# Patient Record
Sex: Male | Born: 1985 | Race: White | Hispanic: No | Smoking: Current every day smoker
Health system: Southern US, Community
[De-identification: ages and names within clinical notes are randomized; demographics above are authoritative.]

## PROBLEM LIST (undated history)

## (undated) DIAGNOSIS — G8929 Other chronic pain: Secondary | ICD-10-CM

## (undated) DIAGNOSIS — M419 Scoliosis, unspecified: Secondary | ICD-10-CM

## (undated) DIAGNOSIS — M5430 Sciatica, unspecified side: Secondary | ICD-10-CM

## (undated) DIAGNOSIS — M549 Dorsalgia, unspecified: Secondary | ICD-10-CM

---

## 2012-01-11 ENCOUNTER — Emergency Department (HOSPITAL_COMMUNITY)
Admission: EM | Admit: 2012-01-11 | Discharge: 2012-01-11 | Disposition: A | Discharge status: Home / Self Care | Payer: No Typology Code available for payment source | Attending: Emergency Medicine | Admitting: Emergency Medicine

## 2012-01-11 ENCOUNTER — Emergency Department (HOSPITAL_COMMUNITY): Payer: Self-pay

## 2012-01-11 ENCOUNTER — Encounter (HOSPITAL_COMMUNITY): Payer: Self-pay | Admitting: Emergency Medicine

## 2012-01-11 DIAGNOSIS — IMO0002 Reserved for concepts with insufficient information to code with codable children: Secondary | ICD-10-CM | POA: Insufficient documentation

## 2012-01-11 DIAGNOSIS — Y9389 Activity, other specified: Secondary | ICD-10-CM | POA: Insufficient documentation

## 2012-01-11 DIAGNOSIS — Z8739 Personal history of other diseases of the musculoskeletal system and connective tissue: Secondary | ICD-10-CM | POA: Insufficient documentation

## 2012-01-11 DIAGNOSIS — S0993XA Unspecified injury of face, initial encounter: Secondary | ICD-10-CM | POA: Insufficient documentation

## 2012-01-11 DIAGNOSIS — F17200 Nicotine dependence, unspecified, uncomplicated: Secondary | ICD-10-CM | POA: Insufficient documentation

## 2012-01-11 DIAGNOSIS — M549 Dorsalgia, unspecified: Secondary | ICD-10-CM

## 2012-01-11 DIAGNOSIS — S199XXA Unspecified injury of neck, initial encounter: Secondary | ICD-10-CM | POA: Insufficient documentation

## 2012-01-11 HISTORY — DX: Scoliosis, unspecified: M41.9

## 2012-01-11 MED ORDER — HYDROMORPHONE HCL PF 1 MG/ML IJ SOLN
1.0000 mg | Freq: Once | INTRAMUSCULAR | Status: AC
  Administered 2012-01-11: 1 mg via INTRAMUSCULAR
  Filled 2012-01-11: qty 1

## 2012-01-11 MED ORDER — DIAZEPAM 5 MG PO TABS
5.0000 mg | ORAL_TABLET | Freq: Once | ORAL | Status: AC
  Administered 2012-01-11: 5 mg via ORAL
  Filled 2012-01-11: qty 1

## 2012-01-11 MED ORDER — DIAZEPAM 5 MG PO TABS: 5.0000 mg | ORAL_TABLET | Freq: Three times a day (TID) | ORAL | Status: DC | PRN

## 2012-01-11 MED ORDER — IBUPROFEN 800 MG PO TABS: 800.0000 mg | ORAL_TABLET | Freq: Three times a day (TID) | ORAL | Status: DC | PRN

## 2012-01-11 MED ORDER — OXYCODONE-ACETAMINOPHEN 5-325 MG PO TABS: 1.0000 | ORAL_TABLET | Freq: Four times a day (QID) | ORAL | Status: DC | PRN

## 2012-01-11 MED ORDER — OXYCODONE-ACETAMINOPHEN 5-325 MG PO TABS
1.0000 | ORAL_TABLET | Freq: Once | ORAL | Status: AC
  Administered 2012-01-11: 1 via ORAL
  Filled 2012-01-11: qty 1

## 2012-01-11 MED ORDER — IBUPROFEN 800 MG PO TABS
800.0000 mg | ORAL_TABLET | Freq: Once | ORAL | Status: AC
  Administered 2012-01-11: 800 mg via ORAL
  Filled 2012-01-11: qty 1
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
## 2012-01-11 NOTE — ED Notes (Signed)
Patient transported to X-ray 
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
## 2012-01-12 NOTE — ED Provider Notes (Signed)
Medical screening examination/treatment/procedure(s) were performed by non-physician practitioner and as supervising physician I was immediately available for consultation/collaboration.  Juliet Rude. Rubin Payor, MD 01/12/12 0003
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
## 2015-01-12 ENCOUNTER — Encounter (HOSPITAL_COMMUNITY): Payer: Self-pay | Admitting: Emergency Medicine

## 2015-01-12 ENCOUNTER — Emergency Department (HOSPITAL_COMMUNITY)
Admission: EM | Admit: 2015-01-12 | Discharge: 2015-01-12 | Disposition: A | Discharge status: Home / Self Care | Payer: Self-pay | Attending: Emergency Medicine | Admitting: Emergency Medicine

## 2015-01-12 DIAGNOSIS — Z79899 Other long term (current) drug therapy: Secondary | ICD-10-CM | POA: Insufficient documentation

## 2015-01-12 DIAGNOSIS — M62830 Muscle spasm of back: Secondary | ICD-10-CM | POA: Insufficient documentation

## 2015-01-12 DIAGNOSIS — Z880 Allergy status to penicillin: Secondary | ICD-10-CM | POA: Insufficient documentation

## 2015-01-12 DIAGNOSIS — F17200 Nicotine dependence, unspecified, uncomplicated: Secondary | ICD-10-CM | POA: Insufficient documentation

## 2015-01-12 DIAGNOSIS — M545 Low back pain: Secondary | ICD-10-CM | POA: Insufficient documentation

## 2015-01-12 DIAGNOSIS — M419 Scoliosis, unspecified: Secondary | ICD-10-CM | POA: Insufficient documentation

## 2015-01-12 MED ORDER — IBUPROFEN 800 MG PO TABS: 800.0000 mg | ORAL_TABLET | Freq: Three times a day (TID) | ORAL | Status: DC

## 2015-01-12 MED ORDER — KETOROLAC TROMETHAMINE 60 MG/2ML IM SOLN
60.0000 mg | Freq: Once | INTRAMUSCULAR | Status: AC
  Administered 2015-01-12: 60 mg via INTRAMUSCULAR
  Filled 2015-01-12: qty 2

## 2015-01-12 MED ORDER — DEXAMETHASONE SODIUM PHOSPHATE 10 MG/ML IJ SOLN
10.0000 mg | Freq: Once | INTRAMUSCULAR | Status: AC
  Administered 2015-01-12: 10 mg via INTRAMUSCULAR
  Filled 2015-01-12: qty 1

## 2015-01-12 MED ORDER — CYCLOBENZAPRINE HCL 10 MG PO TABS
5.0000 mg | ORAL_TABLET | Freq: Once | ORAL | Status: AC
  Administered 2015-01-12: 5 mg via ORAL
  Filled 2015-01-12: qty 1

## 2015-01-12 MED ORDER — CYCLOBENZAPRINE HCL 5 MG PO TABS: 5.0000 mg | ORAL_TABLET | Freq: Three times a day (TID) | ORAL | Status: AC | PRN

## 2015-01-12 NOTE — ED Provider Notes (Signed)
CSN: [id number]     Arrival date & time 01/12/15  1544 History  By signing my name below, I, Doreatha Martin, attest that this documentation has been prepared under the direction and in the presence of Cheri Fowler, PA-C. Electronically Signed: Doreatha Martin, ED Scribe. 01/12/2015. 4:47 PM.    Chief Complaint  Patient presents with  . Back Pain   The history is provided by the patient. No language interpreter was used.   HPI Comments: Gregory Prince is a 30 y.o. male with h/o scoliosis who presents to the Emergency Department complaining of moderate, gradual onset and worsening right lower back pain for 4 days with associated pain radiation to the left leg. Pt states his current pain feels like his h/o scoliosis. He states he has tried ice and 800 mg ibuprofen x3/day with mild to moderate temporary relief of pain. Pt notes that pain is worsened with ambulation and movement. He reports he has not been seen by a specialist for his scoliosis since 2009 due to a lack of insurance, but was followed in the past by various providers. No noted re-injury or trauma. No Hx of cancer, IV drug use. He denies fever, focal weakness, abdominal pain, dysuria, hematuria, bowel or bladder incontinence, saddle anesthesia.     Past Medical History  Diagnosis Date  . Scoliosis    History reviewed. No pertinent past surgical history. No family history on file. Social History  Substance Use Topics  . Smoking status: Current Every Day Smoker  . Smokeless tobacco: None  . Alcohol Use: No    Review of Systems A complete 10 system review of systems was obtained and all systems are negative except as noted in the HPI and PMH.    Allergies  Amoxicillin and Penicillins  Home Medications   Prior to Admission medications   Medication Sig Start Date End Date Taking? Authorizing Provider  Acetaminophen-Aspirin Buffered (EXCEDRIN BACK & BODY) 250-250 MG tablet Take 2 tablets by mouth every 4 (four) hours as needed. For  pain.    Historical Provider, MD  cyclobenzaprine (FLEXERIL) 5 MG tablet Take 1 tablet (5 mg total) by mouth 3 (three) times daily as needed for muscle spasms. 01/12/15   Keelyn Monjaras, PA-C  diazepam (VALIUM) 5 MG tablet Take 1 tablet (5 mg total) by mouth every 8 (eight) hours as needed for anxiety. 01/11/12   Lisette Paz, PA-C  ibuprofen (ADVIL,MOTRIN) 800 MG tablet Take 1 tablet (800 mg total) by mouth 3 (three) times daily. 01/12/15   Cheri Fowler, PA-C  Multiple Vitamin (MULTIVITAMIN WITH MINERALS) TABS Take 1 tablet by mouth daily.    Historical Provider, MD  oxyCODONE-acetaminophen (PERCOCET/ROXICET) 5-325 MG per tablet Take 1 tablet by mouth every 6 (six) hours as needed for pain. 01/11/12   Lisette Paz, PA-C  vitamin C (ASCORBIC ACID) 500 MG tablet Take 500 mg by mouth daily.    Historical Provider, MD   BP 131/86 mmHg  Pulse 87  Temp(Src) 97.4 F (36.3 C) (Oral)  Resp 16  Ht 5\' 9"  (1.753 m)  Wt 79.379 kg  BMI 25.83 kg/m2  SpO2 100% Physical Exam  Constitutional: He is oriented to person, place, and time. He appears well-developed and well-nourished.  HENT:  Head: Normocephalic and atraumatic.  Eyes: Conjunctivae and EOM are normal. Pupils are equal, round, and reactive to light.  Neck: Normal range of motion. Neck supple.  Cardiovascular: Normal rate, regular rhythm and normal heart sounds.  Exam reveals no gallop and no friction  rub.   No murmur heard. Pulses:      Dorsalis pedis pulses are 2+ on the right side, and 2+ on the left side.  Pulmonary/Chest: Effort normal and breath sounds normal. No respiratory distress.  Abdominal: Soft. Bowel sounds are normal. He exhibits no distension.  Musculoskeletal: Normal range of motion. He exhibits tenderness.       Lumbar back: He exhibits spasm.       Back:  Mildly TTP in the lumbar spine. Muscle spasm noted in the right lower lumbar musculature. No step-offs or crepitus.   Neurological: He is alert and oriented to person, place, and  time.  No saddle anesthesia. Strength and sensation intact bilaterally throughout the lower extremities.   Skin: Skin is warm and dry.  Psychiatric: He has a normal mood and affect. His behavior is normal.  Nursing note and vitals reviewed.  ED Course  Procedures (including critical care time) DIAGNOSTIC STUDIES: Oxygen Saturation is 97% on RA, normal by my interpretation.    COORDINATION OF CARE: 4:43 PM Discussed treatment plan with pt at bedside which includes IM Toradol and Decadron and pt agreed to plan. Will d/c with Flexeril.    MDM   Final diagnoses:  Low back pain, unspecified back pain laterality, with sciatica presence unspecified   Filed Vitals:   01/12/15 1600 01/12/15 1720  BP: 147/96 131/86  Pulse: 115 87  Temp: 97.4 F (36.3 C)   Resp: 16 16     Meds given in ED:  Medications  ketorolac (TORADOL) injection 60 mg (60 mg Intramuscular Given 01/12/15 1705)  dexamethasone (DECADRON) injection 10 mg (10 mg Intramuscular Given 01/12/15 1705)  cyclobenzaprine (FLEXERIL) tablet 5 mg (5 mg Oral Given 01/12/15 1706)    New Prescriptions   CYCLOBENZAPRINE (FLEXERIL) 5 MG TABLET    Take 1 tablet (5 mg total) by mouth 3 (three) times daily as needed for muscle spasms.   IBUPROFEN (ADVIL,MOTRIN) 800 MG TABLET    Take 1 tablet (800 mg total) by mouth 3 (three) times daily.    Patient with back pain and likely muscle spasm.  No neurological deficits and normal neuro exam.  Patient is ambulatory.  No loss of bowel or bladder control.  No concern for cauda equina. No red flags. No fever, night sweats, weight loss, h/o cancer, IVDA, no recent procedure to back. No urinary symptoms suggestive of UTI.  Initial vitals showed HR 115, likely secondary to walking in from parking lot.  Upon recheck, VSS.  Blood pressure 131/86, pulse 87, temperature 97.4 F (36.3 C), temperature source Oral, resp. rate 16, height 5\' 9"  (1.753 m), weight 79.379 kg, SpO2 100 %. Supportive care and  return precaution discussed. Pain management with Flexeril and continued ibuprofen and ice use. Appears safe for discharge at this time. Follow up as indicated in discharge paperwork.    I personally performed the services described in this documentation, which was scribed in my presence. The recorded information has been reviewed and is accurate.     Cheri FowlerKayla Artez Regis, PA-C 01/12/15 1727  Mancel BaleElliott Wentz, MD 01/12/15 [phone]

## 2015-01-12 NOTE — ED Notes (Signed)
Pt states for the last 3 days he has been having worsening back pain that is now radating into his left leg. Pt states he has a history of scoliosis and feels like similar pain he has had in the past. Pt denies any numbness or tingling. Pt denies loss of control of bowel or bladder.

## 2015-01-12 NOTE — Discharge Instructions (Signed)
Back Pain, Adult °Back pain is very common in adults. The cause of back pain is rarely dangerous and the pain often gets better over time. The cause of your back pain may not be known. Some common causes of back pain include: °· Strain of the muscles or ligaments supporting the spine. °· Wear and tear (degeneration) of the spinal disks. °· Arthritis. °· Direct injury to the back. °For many people, back pain may return. Since back pain is rarely dangerous, most people can learn to manage this condition on their own. °HOME CARE INSTRUCTIONS °Watch your back pain for any changes. The following actions may help to lessen any discomfort you are feeling: °· Remain active. It is stressful on your back to sit or stand in one place for long periods of time. Do not sit, drive, or stand in one place for more than 30 minutes at a time. Take short walks on even surfaces as soon as you are able. Try to increase the length of time you walk each day. °· Exercise regularly as directed by your health care provider. Exercise helps your back heal faster. It also helps avoid future injury by keeping your muscles strong and flexible. °· Do not stay in bed. Resting more than 1-2 days can delay your recovery. °· Pay attention to your body when you bend and lift. The most comfortable positions are those that put less stress on your recovering back. Always use proper lifting techniques, including: °¨ Bending your knees. °¨ Keeping the load close to your body. °¨ Avoiding twisting. °· Find a comfortable position to sleep. Use a firm mattress and lie on your side with your knees slightly bent. If you lie on your back, put a pillow under your knees. °· Avoid feeling anxious or stressed. Stress increases muscle tension and can worsen back pain. It is important to recognize when you are anxious or stressed and learn ways to manage it, such as with exercise. °· Take medicines only as directed by your health care provider. Over-the-counter  medicines to reduce pain and inflammation are often the most helpful. Your health care provider may prescribe muscle relaxant drugs. These medicines help dull your pain so you can more quickly return to your normal activities and healthy exercise. °· Apply ice to the injured area: °¨ Put ice in a plastic bag. °¨ Place a towel between your skin and the bag. °¨ Leave the ice on for 20 minutes, 2-3 times a day for the first 2-3 days. After that, ice and heat may be alternated to reduce pain and spasms. °· Maintain a healthy weight. Excess weight puts extra stress on your back and makes it difficult to maintain good posture. °SEEK MEDICAL CARE IF: °· You have pain that is not relieved with rest or medicine. °· You have increasing pain going down into the legs or buttocks. °· You have pain that does not improve in one week. °· You have night pain. °· You lose weight. °· You have a fever or chills. °SEEK IMMEDIATE MEDICAL CARE IF:  °· You develop new bowel or bladder control problems. °· You have unusual weakness or numbness in your arms or legs. °· You develop nausea or vomiting. °· You develop abdominal pain. °· You feel faint. °  °This information is not intended to replace advice given to you by your health care provider. Make sure you discuss any questions you have with your health care provider. °  °Document Released: 12/19/2004 Document Revised: 01/09/2014 Document Reviewed: 04/22/2013 °Elsevier Interactive Patient Education ©2016 Elsevier   Inc. ° °Emergency Department Resource Guide °1) Find a Doctor and Pay Out of Pocket °Although you won't have to find out who is covered by your insurance plan, it is a good idea to ask around and get recommendations. You will then need to call the office and see if the doctor you have chosen will accept you as a new patient and what types of options they offer for patients who are self-pay. Some doctors offer discounts or will set up payment plans for their patients who do not  have insurance, but you will need to ask so you aren't surprised when you get to your appointment. ° °2) Contact Your Local Health Department °Not all health departments have doctors that can see patients for sick visits, but many do, so it is worth a call to see if yours does. If you don't know where your local health department is, you can check in your phone book. The CDC also has a tool to help you locate your state's health department, and many state websites also have listings of all of their local health departments. ° °3) Find a Walk-in Clinic °If your illness is not likely to be very severe or complicated, you may want to try a walk in clinic. These are popping up all over the country in pharmacies, drugstores, and shopping centers. They're usually staffed by nurse practitioners or physician assistants that have been trained to treat common illnesses and complaints. They're usually fairly quick and inexpensive. However, if you have serious medical issues or chronic medical problems, these are probably not your best option. ° °No Primary Care Doctor: °- Call [facility] at  832-8000 - they can help you locate a primary care doctor that  accepts your insurance, provides certain services, etc. °- Physician Referral Service- 1-800-533-3463 ° °Chronic Pain Problems: °Organization         Address  Phone   Notes  °[facility]  (336) 297-2271 Patients need to be referred by their primary care doctor.  ° °Medication Assistance: °Organization         Address  Phone   Notes  °[place] [facility] [address] °[place], [place] [address] (336) 641-8030 --Must be a resident of [place] °-- Must have NO insurance coverage whatsoever (no Medicaid/ Medicare, etc.) °-- The pt. MUST have a primary care doctor that directs their care regularly and follows them in the community °  °[facility]  (866) 331-1348   °[employer]  (888) 892-1162   ° °Agencies that  provide inexpensive medical care: °Organization         Address  Phone   Notes  °[facility]  (336) 832-8035   °[facility]    (336) 832-7272   °[facility] [address] °[place], [place] [address] (336) 832-4777   °[facility] [address], °[place] (336) 271-4999   °[facility]    (336) 373-0678   °[facility]    (336) 272-1050   °[facility] ° [address] Phone:  (336) 832-4444, Fax:  (336) 832-4440 Hours of Operation:  9 am - 6 pm, M-F.  Also accepts Medicaid/Medicare and self-pay.  °[facility] ° [address], [place] Phone: (336) 832-3150, Fax: (336) 832-3151. Hours of Operation:  8:30 am - 5:30 pm, M-F.  Also accepts Medicaid and self-pay.  °[facility]   [place] [address], [place] Phone: (336) 878-6027   °[facility] [address], [place], [place] (336)723-1848, Ext. 123 Mondays & Thursdays: 7-9 AM.  First 15 patients are seen on a first come, first serve basis. °  ° °Medicaid-accepting [place] Providers: ° °Organization         Address  Phone   Notes  °[facility] [address], [place] (336) 641-2100 Also accepts self-pay patients.  °[facility] [address], [place] ° (336) 856-9996   °[facility] [address], [place] (336) 288-8857   °[facility] [address] [place] [address], [place] (336) 299-7000   °Veita Bland [address], [place]  ° (336) 373-1557 Only accepts [place] Access Medicaid patients after they have their name applied to their card.  ° °Self-Pay (no insurance) in [place]: ° °Organization         Address  Phone   Notes  °Sickle Cell Patients, [facility] [address], [place] (336) 832-1970   °[facility] [address], [place] (336) 832-4400   °[facility] ° [address],  (336) 992-4800   °[facility]/Dr. Osei-Bonsu ° [address] [place] [address], [place] or [address], [place] (336) 841-8500 Phone number for both [place] and [place] locations is the same.  °[facility] [address], [place] (336) 299-0000   °[facility] [address] [place] [address], [place] or [address] (336) 852-7530 °(336) 878-2260   °[facility] [address], [place] (336) 350-1642, phone; (336) 294-5005, fax Sees patients 1st and 3rd Saturday of every month.  Must not qualify for public or private insurance (i.e. Medicaid, Medicare, [place] Health Choice, Veterans' Benefits) • Household income should be no more than 200% of the poverty level •The clinic cannot treat you if you are pregnant or think you are pregnant • Sexually transmitted diseases are not treated at the clinic.  ° ° °Dental Care: °Organization         Address  Phone  Notes  °[facility] [address], [place] (336) 641-6152 Accepts children up to age 21 who are enrolled in Medicaid or [place] Health Choice; pregnant women with a Medicaid card; and children who have applied for Medicaid or [place] Health Choice, but were declined, whose parents can pay a reduced fee at time of service.  °[facility] [place]  [address], [place] (336) 641-7733 Accepts children up to age 21 who are enrolled in Medicaid or [place] Health Choice; pregnant women with a Medicaid card; and children who have applied for Medicaid or [place] Health Choice, but were declined, whose parents can pay a reduced fee at time of service.  °[facility] ° [address], [place] (336) 641-4533 Patients are seen by appointment only. Walk-ins  are not accepted. [facility] will see patients 18 years of age and older. °Monday - Tuesday (8am-5pm) °Most Wednesdays (8:30-5pm) °$30 per visit, cash only  °[facility] ° [address], [place] (336) 641-4533 Patients are seen by appointment only. Walk-ins are not accepted. [facility] will see patients 18 years of age   and older. °One Wednesday Evening (Monthly: Volunteer Based).  $30 per visit, cash only  °[facility]  (919) 537-3737 for adults; Children under age 4, call Graduate Pediatric Dentistry at ([phone]) [phone]. Children aged 4-14, please call (919) [phone] to request a pediatric application. ° Dental services are provided in all areas of dental care including fillings, crowns and bridges, complete and partial dentures, implants, gum treatment, root canals, and extractions. Preventive care is also provided. Treatment is provided to both adults and children. °Patients are selected via a lottery and there is often a waiting list. °  °[facility] [address], °[place] ° (336) 763-8833 [email or web address] °  °[facility] [address], [place], [place] (336)723-1848, Ext. 123 Second and Fourth Thursday of each month, opens at 6:30 AM; Clinic ends at 9 AM.  Patients are seen on a first-come first-served basis, and a limited number are seen during each clinic.  ° °[facility] ° [address], [place], [place] (336) 723-7904   Eligibility Requirements °You must have lived in [place], [place], or [place] counties for at least the last three months. °  You cannot be eligible for state or federal sponsored healthcare insurance, including Veterans Administration, Medicaid, or Medicare. °  You generally cannot be eligible for healthcare insurance through your employer.  °  How to apply: °Eligibility screenings are held every Tuesday and Wednesday afternoon from 1:00 pm until 4:00 pm. You do not need an appointment  for the interview!  °[facility] [address], [place], [place] 336-631-2330   °[facility]  336-342-8273   °[facility]  336-703-3100   °[facility]  336-570-6415   ° °Behavioral Health Resources in the Community: °Intensive Outpatient Programs °Organization         Address  Phone  Notes  °[facility] [address], [place], [place] 336-878-6098   °[facility] [address], [place], [place] 336-832-9800   °[facility] [address], [place], [place] ° 336-882-2125   °[facility] [address],  °[place], [place] 1-800-853-5163 or 336-641-4981   °Substance Abuse Resources °Organization         Address  Phone  Notes  °[facility]  336-882-2125   °[facility]  336-784-9470   °[facility]  336-285-9073   °[facility]  336-845-3988   °Residential & Outpatient Substance Abuse Program  1-800-659-3381   °Psychological Services °Organization         Address  Phone  Notes  °[facility]  336- 832-9600   °[facility]  336- 378-7881   °[facility] [address], [place] 1-800-853-5163 or 336-641-4981   ° °Mobile Crisis Teams °Organization         Address  Phone  Notes  °[facility], Mobile Crisis Care Unit  1-877-626-1772   °Assertive °[facility] ° [address] [place], [place] 336-834-9664   °Sharon DeEsch [address] °[place] 336-554-5454   ° °Self-Help/Support Groups °Organization         Address  Phone             Notes  °[facility] - variety of support groups  336- 373-1402 Call for more information  °Narcotics Anonymous (NA), [facility] [address], °[place]  2 meetings at   this location  ° °Residential Treatment Programs °Organization         Address  Phone  Notes  °[facility] [address],    °[place]  1-866-801-8205   °[facility] ° [address], [place], [place] 704-293-8524   °[facility] [address], [place] 336-845-3988 Admissions: 8am-3pm M-F  °[facility] [address]    °[place], [place] 336-841-1104   °[facility] [address], [place], [place] 336-379-7146   °[facility] [address]  °[place], [place] 336-285-9073   °[facility] Intensive Outpatient [address], [place], [place] 336-852-3033   °[facility] [address]  °[place], [place] 1-877-615-2722 or 336-784-9470   °[facility] [address] [place], [place] 336-227-7417 Accepts Medicaid  °[facility] [address]  °[place] 1-800-659-3381 Substance Abuse/Addiction Treatment  ° °[place] Behavioral Health Resources °Organization         Address  Phone  Notes  °[facility]  (888) 581-9988   °Julie Brannon, PhD [address] [place], [place]   (336) 349-5553 or (336) 951-0000   °[facility]   [address] °[place], [place] (336) 349-4454   °[facility] [address], [place], [place] (336) 342-8316 Insurance/Medicaid/sponsorship through [facility]  °[facility] [address]                                    [place], [place] (336) 342-8316 Therapy/tele-psych/case  °[facility] [address]  ° [place], [place] (336) 349-2233    °Dr. Arfeen  (336) 349-4544   °[facility] [place]  [employer] [place] [facility] 1) [address],  °2) [address], [place] °3)  [address], [place] (336) 349-3220 °(336) 342-7768 ° °(336) 342-8140   °[place] [facility] (336) 342-1394 or (336) 342-3537 (After Hours)    ° ° ° °

## 2015-01-13 ENCOUNTER — Encounter (HOSPITAL_COMMUNITY): Payer: Self-pay | Admitting: *Deleted

## 2015-01-13 ENCOUNTER — Emergency Department (HOSPITAL_COMMUNITY)
Admission: EM | Admit: 2015-01-13 | Discharge: 2015-01-13 | Disposition: A | Discharge status: Home / Self Care | Payer: Self-pay | Attending: Emergency Medicine | Admitting: Emergency Medicine

## 2015-01-13 DIAGNOSIS — R000 Tachycardia, unspecified: Secondary | ICD-10-CM | POA: Insufficient documentation

## 2015-01-13 DIAGNOSIS — G8929 Other chronic pain: Secondary | ICD-10-CM | POA: Insufficient documentation

## 2015-01-13 DIAGNOSIS — M549 Dorsalgia, unspecified: Secondary | ICD-10-CM

## 2015-01-13 DIAGNOSIS — Z880 Allergy status to penicillin: Secondary | ICD-10-CM | POA: Insufficient documentation

## 2015-01-13 DIAGNOSIS — M5442 Lumbago with sciatica, left side: Secondary | ICD-10-CM | POA: Insufficient documentation

## 2015-01-13 DIAGNOSIS — F17200 Nicotine dependence, unspecified, uncomplicated: Secondary | ICD-10-CM | POA: Insufficient documentation

## 2015-01-13 DIAGNOSIS — Z79899 Other long term (current) drug therapy: Secondary | ICD-10-CM | POA: Insufficient documentation

## 2015-01-13 HISTORY — DX: Dorsalgia, unspecified: M54.9

## 2015-01-13 HISTORY — DX: Other chronic pain: G89.29

## 2015-01-13 MED ORDER — OXYCODONE-ACETAMINOPHEN 5-325 MG PO TABS
1.0000 | ORAL_TABLET | Freq: Once | ORAL | Status: AC
Start: 2015-01-13 — End: 2015-01-13
  Administered 2015-01-13: 1 via ORAL
  Filled 2015-01-13: qty 1

## 2015-01-13 MED ORDER — KETOROLAC TROMETHAMINE 30 MG/ML IJ SOLN
30.0000 mg | Freq: Once | INTRAMUSCULAR | Status: AC
  Administered 2015-01-13: 30 mg via INTRAVENOUS
  Filled 2015-01-13: qty 1

## 2015-01-13 MED ORDER — SODIUM CHLORIDE 0.9 % IV BOLUS (SEPSIS)
1000.0000 mL | Freq: Once | INTRAVENOUS | Status: AC
  Administered 2015-01-13: 1000 mL via INTRAVENOUS

## 2015-01-13 MED ORDER — OXYCODONE-ACETAMINOPHEN 5-325 MG PO TABS: 1.0000 | ORAL_TABLET | Freq: Four times a day (QID) | ORAL | Status: DC | PRN

## 2015-01-13 MED ORDER — ONDANSETRON 4 MG PO TBDP
4.0000 mg | ORAL_TABLET | Freq: Once | ORAL | Status: AC
  Administered 2015-01-13: 4 mg via ORAL
  Filled 2015-01-13: qty 1

## 2015-01-13 NOTE — ED Provider Notes (Signed)
CSN: [id number]     Arrival date & time 01/13/15  1191 History   First MD Initiated Contact with Patient 01/13/15 [phone]     Chief Complaint  Patient presents with  . Back Pain   HPI  Mr. Coppola is an 30 y.o. male with history of scoliosis and chronic back pain who presents to the ED for evaluation of back pain. He was seen in the ED yesterday for the same complaint. Pt states he has had scoliosis since he was a teenager and has always had issues with chronic back pain. He states that he recently lost his insurance and so has not had any of his chronic pain medications that he used in the past. He states that from time to time he will get pain flare ups with pain in his low back that radiate down his left leg and sometimes up his back. He states that normally the episodes last a day or two and resolve with ice and NSAIDs but this episode started on 01/09/15 and he has had no relief with ibuprofen. He was seen in the ED yesterday and given toradol, decadron, and rx for flexeril. He states that the medications have helped his muscle spasms but he states there is still sharp severe pain in his low back. He denies new injury or trauma. Denies fever, chills, new numbness/tingling/weakness. Denies saddle paresthesias, bowel/bladder incontinence, IVDU.  Pt has been tachycardic in the ED, initially to the 140s in triage where he was apparently also diaphoretic and SOB. In the room now pt is breathing comfortably, not diaphoretic, and has maintained SpO2 98-100% on RA. His heart rate during our initial conversation has stayed between 125-135. I suspect pt's tachycardia is secondary to his degree of pain as he does appear to be quite uncomfortable. He denies chest pain, difficulty breathing, feeling dizzy or lightheaded.  Past Medical History  Diagnosis Date  . Scoliosis   . Chronic back pain    History reviewed. No pertinent past surgical history. History reviewed. No pertinent family history. Social History   Substance Use Topics  . Smoking status: Current Every Day Smoker  . Smokeless tobacco: None  . Alcohol Use: No    Review of Systems  All other systems reviewed and are negative.     Allergies  Amoxicillin and Penicillins  Home Medications   Prior to Admission medications   Medication Sig Start Date End Date Taking? Authorizing Provider  Acetaminophen-Aspirin Buffered (EXCEDRIN BACK & BODY) 250-250 MG tablet Take 2 tablets by mouth every 4 (four) hours as needed. For pain.   Yes Historical Provider, MD  cyclobenzaprine (FLEXERIL) 5 MG tablet Take 1 tablet (5 mg total) by mouth 3 (three) times daily as needed for muscle spasms. 01/12/15  Yes Kayla Rose, PA-C  ibuprofen (ADVIL,MOTRIN) 800 MG tablet Take 1 tablet (800 mg total) by mouth 3 (three) times daily. 01/12/15  Yes Cheri Fowler, PA-C  Multiple Vitamin (MULTIVITAMIN WITH MINERALS) TABS Take 1 tablet by mouth daily.   Yes Historical Provider, MD  oxyCODONE-acetaminophen (PERCOCET/ROXICET) 5-325 MG per tablet Take 1 tablet by mouth every 6 (six) hours as needed for pain. 01/11/12  Yes Lisette Paz, PA-C  vitamin C (ASCORBIC ACID) 500 MG tablet Take 500 mg by mouth daily.   Yes Historical Provider, MD   BP 118/84 mmHg  Pulse 129  Temp(Src) 97.5 F (36.4 C) (Oral)  Resp 24  SpO2 98% Physical Exam  Constitutional: He is oriented to person, place, and time. No distress.  Appears uncomfortable but NAD  HENT:  Right Ear: External ear normal.  Left Ear: External ear normal.  Nose: Nose normal.  Mouth/Throat: Oropharynx is clear and moist.  Eyes: Conjunctivae and EOM are normal. Pupils are equal, round, and reactive to light.  Neck: Full passive range of motion without pain. Neck supple.  Cardiovascular: Regular rhythm, normal heart sounds and intact distal pulses.  Tachycardia present.   Pulmonary/Chest: Effort normal and breath sounds normal. No respiratory distress. He exhibits no tenderness.  Abdominal: Soft. Bowel sounds are  normal. He exhibits no distension. There is no tenderness.  Musculoskeletal: He exhibits no edema.  Low back paraspinal muscles diffusely tender to palpation with some spasm. No spinal tenderness. Intact strength and sensation in upper and lower extremities.   Neurological: He is alert and oriented to person, place, and time. No cranial nerve deficit. Coordination and gait normal.  Skin: Skin is warm and dry. He is not diaphoretic.  Psychiatric: He has a normal mood and affect.  Nursing note and vitals reviewed.  Filed Vitals:   01/13/15 1130 01/13/15 1200 01/13/15 1230 01/13/15 1249  BP: 128/87 128/82 119/73 119/73  Pulse: 100 111 94 105  Temp:      TempSrc:      Resp: 16   18  SpO2: 100% 99% 100% 100%     ED Course  Procedures (including critical care time) Labs Review Labs Reviewed - No data to display  Imaging Review No results found. I have personally reviewed and evaluated these images and lab results as part of my medical decision-making.   EKG Interpretation None      MDM   Final diagnoses:  Bilateral low back pain with left-sided sciatica    Pt is an 30 y.o. male with history of scoliosis and chronic back pain in the ED today for severe flare up of back pain. He was seen yesterday for same. No red flag symptoms for cauda equina, epidural abscess, neoplastic etiology. No new injury or trauma. No focal neuro findings. Pain improved with percocet and toradol in the ED. Pt was initially tachycardic to the 140s in triage, improved to 90s with 2L NS bolus and pain meds. I suspect pt's tachycardia is secondary to back pain as he is having no chest pain, SOB, increased work of breathing, hypoxia, or tachypnea to suggest other more emergent cardiopulmonary etiology. Given acute on chronic flare of scoliosis related back pain I will give short prescription for percocet. Pt instructed to continue the flexeril he has at home. HE may also continue to take NSAIDs as needed. Resource  guide given to re-establish primary care. ER return precautions given.     Carlene CoriaSerena Y Melesio Madara, PA-C 01/13/15 1636  Arby BarretteMarcy Pfeiffer, MD 01/14/15 1345

## 2015-01-13 NOTE — ED Notes (Signed)
Pr present with back pain that was seen yesterday and returns today with pai n 10/10. Pt HR is 142 and diaphoreticwith SHOB.

## 2015-01-13 NOTE — Discharge Instructions (Signed)
You were seen in the emergency room today for back pain. I will give you a small prescription for Percocet to help you through this flare. Please call one of the clinics below to establish primary care for ongoing management of your back pain and scoliosis. In the meantime you may also continue taking the Flexeril you were prescribed. You may also take ibuprofen 800mg  three times daily as needed.  Take medications as prescribed. Return to the emergency room for worsening condition or new concerning symptoms. Follow up with your regular doctor. If you don't have a regular doctor use one of the numbers below to establish a primary care doctor.   Emergency Department Resource Guide 1) Find a Doctor and Pay Out of Pocket Although you won't have to find out who is covered by your insurance plan, it is a good idea to ask around and get recommendations. You will then need to call the office and see if the doctor you have chosen will accept you as a new patient and what types of options they offer for patients who are self-pay. Some doctors offer discounts or will set up payment plans for their patients who do not have insurance, but you will need to ask so you aren't surprised when you get to your appointment.  2) Contact Your Local Health Department Not all health departments have doctors that can see patients for sick visits, but many do, so it is worth a call to see if yours does. If you don't know where your local health department is, you can check in your phone book. The CDC also has a tool to help you locate your state's health department, and many state websites also have listings of all of their local health departments.  3) Find a Walk-in Clinic If your illness is not likely to be very severe or complicated, you may want to try a walk in clinic. These are popping up all over the country in pharmacies, drugstores, and shopping centers. They're usually staffed by nurse practitioners or physician  assistants that have been trained to treat common illnesses and complaints. They're usually fairly quick and inexpensive. However, if you have serious medical issues or chronic medical problems, these are probably not your best option.  No Primary Care Doctor: - Call Health Connect at  [phone] - they can help you locate a primary care doctor that  accepts your insurance, provides certain services, etc. - Physician Referral [phone]  Emergency Department Resource Guide 1) Find a Doctor and Pay Out of Pocket Although you won't have to find out who is covered by your insurance plan, it is a good idea to ask around and get recommendations. You will then need to call the office and see if the doctor you have chosen will accept you as a new patient and what types of options they offer for patients who are self-pay. Some doctors offer discounts or will set up payment plans for their patients who do not have insurance, but you will need to ask so you aren't surprised when you get to your appointment.  2) Contact Your Local Health Department Not all health departments have doctors that can see patients for sick visits, but many do, so it is worth a call to see if yours does. If you don't know where your local health department is, you can check in your phone book. The CDC also has a tool to help you locate your state's health department, and many state websites also have  listings of all of their local health departments.  3) Find a Walk-in Clinic If your illness is not likely to be very severe or complicated, you may want to try a walk in clinic. These are popping up all over the country in pharmacies, drugstores, and shopping centers. They're usually staffed by nurse practitioners or physician assistants that have been trained to treat common illnesses and complaints. They're usually fairly quick and inexpensive. However, if you have serious medical issues or chronic medical problems, these are  probably not your best option.  No Primary Care Doctor: - Call Health Connect at  [phone] - they can help you locate a primary care doctor that  accepts your insurance, provides certain services, etc. - Physician Referral Service- [phone]  Chronic Pain Problems: Organization         Address  Phone   Notes  [facility]  [phone] Patients need to be referred by their primary care doctor.   Medication Assistance: Organization         Address  Phone   Notes  [facility] [address] [place], [place] [address] [phone] --Must be a resident of [facility] [place] -- Must have NO insurance coverage whatsoever (no Medicaid/ Medicare, etc.) -- The pt. MUST have a primary care doctor that directs their care regularly and follows them in the community   [facility]ist  [phone]   [employer]  [phone]    Agencies that provide inexpensive medical care: Organization         Address  Phone   Notes  [facility]  [phone]) [phone]   [facility]    [phone]   [facility] [address] [place], [place] [address] [phone]   [facility] [address], [place] [phone]) [phone]   [facility]    [phone]   [facility]    [phone]   [facility]  [address], [place] Phone:  [phone], Fax:  [phone] Hours of Operation:  9 am - 6 pm, M-F.  Also accepts Medicaid/Medicare and self-pay.  [facility]  [address], [place] Phone: [phone], Fax: [phone]. Hours of Operation:  8:30 am - 5:30 pm, M-F.  Also accepts Medicaid and self-pay.  [facility] [place] [address], [place] Phone: [phone]   [facility] [address], [place], [place] [phone], Ext. [phone] Mondays & Thursdays:  7-9 AM.  First 15 patients are seen on a first come, first serve basis.    Medicaid-accepting [facility] Providers:  Organization         Address  Phone   Notes  [facility] [address], [place] [phone] Also accepts self-pay patients.  [facility] [address], [place]  [phone]   [facility] [address], [place] [phone]   [facility] [address] [place] [address], [place] [phone]   Renaye RakersVeita Bland [address], [place]   [phone] Only accepts [place] patients after they have their name applied to their card.   Self-Pay (no insurance) in [facility] [place]:  [employer]         Address  Phone  Notes  Sickle Cell Patients, [place] [phone]   [facility] [place] [phone]   [facility] [place]  [place], [place], [place] [phone]   [facility]/Dr. Osei-Bonsu  [address] [place] or [address], [place] [phone] Phone number for both [place] and [place] locations is the same.  [facility] [address], [place] [phone]   [facility] [address] [place] or [address] [phone]   [facility] [address], [place] [phone], phone; [phone], fax Sees patients 1st and 3rd Saturday of every month.  Must not qualify for public or private insurance (i.e. Medicaid, Medicare,  Health Choice, Veterans' Benefits)  Household income should be no more than 200% of the poverty level The clinic cannot treat you if you are pregnant or think you are pregnant  Sexually transmitted diseases are not treated at the clinic.

## 2015-01-13 NOTE — ED Notes (Signed)
Radial pulse confirmed with manual check 145 HR

## 2015-02-17 ENCOUNTER — Ambulatory Visit: Payer: Self-pay | Admitting: Internal Medicine

## 2015-02-21 ENCOUNTER — Encounter (HOSPITAL_COMMUNITY): Payer: Self-pay | Admitting: *Deleted

## 2015-02-21 ENCOUNTER — Emergency Department (INDEPENDENT_AMBULATORY_CARE_PROVIDER_SITE_OTHER)
Admission: EM | Admit: 2015-02-21 | Discharge: 2015-02-21 | Disposition: A | Discharge status: Home / Self Care | Payer: No Typology Code available for payment source | Source: Home / Self Care | Attending: Family Medicine | Admitting: Family Medicine

## 2015-02-21 DIAGNOSIS — G8929 Other chronic pain: Secondary | ICD-10-CM

## 2015-02-21 DIAGNOSIS — M5442 Lumbago with sciatica, left side: Secondary | ICD-10-CM

## 2015-02-21 HISTORY — DX: Sciatica, unspecified side: M54.30

## 2015-02-21 MED ORDER — BACLOFEN 20 MG PO TABS
20.0000 mg | ORAL_TABLET | Freq: Three times a day (TID) | ORAL | Status: AC
Start: 2015-02-21 — End: ?

## 2015-02-21 MED ORDER — KETOROLAC TROMETHAMINE 30 MG/ML IJ SOLN
30.0000 mg | Freq: Once | INTRAMUSCULAR | Status: AC
  Administered 2015-02-21: 30 mg via INTRAMUSCULAR

## 2015-02-21 MED ORDER — PREDNISONE 50 MG PO TABS: ORAL_TABLET | ORAL | Status: AC

## 2015-02-21 MED ORDER — METHYLPREDNISOLONE SODIUM SUCC 125 MG IJ SOLR
125.0000 mg | Freq: Once | INTRAMUSCULAR | Status: AC
  Administered 2015-02-21: 125 mg via INTRAMUSCULAR

## 2015-02-21 MED ORDER — METHYLPREDNISOLONE SODIUM SUCC 125 MG IJ SOLR
INTRAMUSCULAR | Status: AC
  Filled 2015-02-21: qty 2

## 2015-02-21 MED ORDER — KETOROLAC TROMETHAMINE 30 MG/ML IJ SOLN
INTRAMUSCULAR | Status: AC
  Filled 2015-02-21: qty 1

## 2015-02-21 NOTE — Discharge Instructions (Signed)
See your doctor as planned. °

## 2015-02-21 NOTE — ED Notes (Signed)
C/O chronic back pain from scoliosis & sciatica.  C/O worsening back pain starting 2 days ago; pain in low back radiating down into LLE with intermittent numbness in LLE (position dependent).  Has been taking IBU.  Pt is in process of obtaining PCP.  Also reports hx HTN over past couple months - reinforced need to obtain PCP with pt.

## 2015-02-21 NOTE — ED Provider Notes (Signed)
CSN: [id number]     Arrival date & time 02/21/15  1445 History   First MD Initiated Contact with Patient 02/21/15 1636     Chief Complaint  Patient presents with  . Back Pain   (Consider location/radiation/quality/duration/timing/severity/associated sxs/prior Treatment) Patient is a 30 y.o. male presenting with back pain. The history is provided by the patient.  Back Pain Location:  Lumbar spine Radiates to:  L posterior upper leg Pain severity:  Mild Onset quality:  Gradual Progression:  Worsening Chronicity:  Chronic Context comment:  Seen recently in ER and given meds for same. Associated symptoms: leg pain   Associated symptoms: no abdominal pain, no chest pain, no dysuria, no numbness, no paresthesias, no pelvic pain and no perianal numbness     Past Medical History  Diagnosis Date  . Scoliosis   . Chronic back pain   . Sciatica    History reviewed. No pertinent past surgical history. No family history on file. Social History  Substance Use Topics  . Smoking status: Current Every Day Smoker -- 0.50 packs/day    Types: Cigarettes  . Smokeless tobacco: None  . Alcohol Use: No    Review of Systems  Constitutional: Negative.   Cardiovascular: Negative for chest pain.  Gastrointestinal: Negative.  Negative for abdominal pain.  Genitourinary: Negative.  Negative for dysuria and pelvic pain.  Musculoskeletal: Positive for back pain. Negative for myalgias, joint swelling and gait problem.  Skin: Negative.   Neurological: Negative for numbness and paresthesias.  All other systems reviewed and are negative.   Allergies  Amoxicillin and Penicillins  Home Medications   Prior to Admission medications   Medication Sig Start Date End Date Taking? Authorizing Provider  ibuprofen (ADVIL,MOTRIN) 800 MG tablet Take 1 tablet (800 mg total) by mouth 3 (three) times daily. 01/12/15  Yes Kayla Rose, PA-C  Acetaminophen-Aspirin Buffered (EXCEDRIN BACK & BODY) 250-250 MG tablet  Take 2 tablets by mouth every 4 (four) hours as needed. For pain.    Historical Provider, MD  baclofen (LIORESAL) 20 MG tablet Take 1 tablet (20 mg total) by mouth 3 (three) times daily. 02/21/15   Linna Hoff, MD  cyclobenzaprine (FLEXERIL) 5 MG tablet Take 1 tablet (5 mg total) by mouth 3 (three) times daily as needed for muscle spasms. 01/12/15   Cheri Fowler, PA-C  Multiple Vitamin (MULTIVITAMIN WITH MINERALS) TABS Take 1 tablet by mouth daily.    Historical Provider, MD  oxyCODONE-acetaminophen (PERCOCET/ROXICET) 5-325 MG per tablet Take 1 tablet by mouth every 6 (six) hours as needed for pain. 01/11/12   Lisette Paz, PA-C  oxyCODONE-acetaminophen (PERCOCET/ROXICET) 5-325 MG tablet Take 1-2 tablets by mouth every 6 (six) hours as needed for severe pain. 01/13/15   Ace Gins Sam, PA-C  predniSONE (DELTASONE) 50 MG tablet 1 tab daily for 2 days then 1/2 tab daily for 2 days. 02/21/15   Linna Hoff, MD  vitamin C (ASCORBIC ACID) 500 MG tablet Take 500 mg by mouth daily.    Historical Provider, MD   Meds Ordered and Administered this Visit   Medications  ketorolac (TORADOL) 30 MG/ML injection 30 mg (30 mg Intramuscular Given 02/21/15 1714)  methylPREDNISolone sodium succinate (SOLU-MEDROL) 125 mg/2 mL injection 125 mg (125 mg Intramuscular Given 02/21/15 1713)    BP 142/99 mmHg  Pulse 104  Temp(Src) 98.1 F (36.7 C) (Oral)  Resp 18  SpO2 99% No data found.   Physical Exam  Constitutional: He is oriented to person, place, and time. He  appears well-developed and well-nourished.  Abdominal: Soft. Bowel sounds are normal. There is no tenderness.  Musculoskeletal: He exhibits tenderness.       Lumbar back: He exhibits decreased range of motion, tenderness, pain and spasm. He exhibits normal pulse.  Neurological: He is alert and oriented to person, place, and time.  Skin: Skin is warm and dry.  Nursing note and vitals reviewed.   ED Course  Procedures (including critical care time)  Labs  Review Labs Reviewed - No data to display  Imaging Review No results found.   Visual Acuity Review  Right Eye Distance:   Left Eye Distance:   Bilateral Distance:    Right Eye Near:   Left Eye Near:    Bilateral Near:         MDM   1. Chronic bilateral low back pain with left-sided sciatica        Linna Hoff, MD 02/21/15 2021

## 2015-02-23 ENCOUNTER — Ambulatory Visit: Payer: Self-pay | Admitting: Internal Medicine

## 2015-03-04 ENCOUNTER — Emergency Department (INDEPENDENT_AMBULATORY_CARE_PROVIDER_SITE_OTHER)
Admission: EM | Admit: 2015-03-04 | Discharge: 2015-03-04 | Disposition: A | Discharge status: Home / Self Care | Payer: No Typology Code available for payment source | Source: Home / Self Care | Attending: Family Medicine | Admitting: Family Medicine

## 2015-03-04 ENCOUNTER — Encounter (HOSPITAL_COMMUNITY): Payer: Self-pay | Admitting: Emergency Medicine

## 2015-03-04 DIAGNOSIS — J069 Acute upper respiratory infection, unspecified: Secondary | ICD-10-CM

## 2015-03-04 MED ORDER — ALBUTEROL SULFATE HFA 108 (90 BASE) MCG/ACT IN AERS: 2.0000 | INHALATION_SPRAY | RESPIRATORY_TRACT | Status: AC | PRN

## 2015-03-04 MED ORDER — IPRATROPIUM-ALBUTEROL 0.5-2.5 (3) MG/3ML IN SOLN
3.0000 mL | Freq: Once | RESPIRATORY_TRACT | Status: AC
  Administered 2015-03-04: 3 mL via RESPIRATORY_TRACT

## 2015-03-04 MED ORDER — IPRATROPIUM-ALBUTEROL 0.5-2.5 (3) MG/3ML IN SOLN
RESPIRATORY_TRACT | Status: AC
  Filled 2015-03-04: qty 3

## 2015-03-04 MED ORDER — AZITHROMYCIN 250 MG PO TABS: 250.0000 mg | ORAL_TABLET | Freq: Every day | ORAL | Status: AC

## 2015-03-04 MED ORDER — SODIUM CHLORIDE 0.9% FLUSH
INTRAVENOUS | Status: AC
  Filled 2015-03-04: qty 10

## 2015-03-04 NOTE — Discharge Instructions (Signed)
Upper Respiratory Infection, Adult °Most upper respiratory infections (URIs) are a viral infection of the air passages leading to the lungs. A URI affects the nose, throat, and upper air passages. The most common type of URI is nasopharyngitis and is typically referred to as "the common cold." °URIs run their course and usually go away on their own. Most of the time, a URI does not require medical attention, but sometimes a bacterial infection in the upper airways can follow a viral infection. This is called a secondary infection. Sinus and middle ear infections are common types of secondary upper respiratory infections. °Bacterial pneumonia can also complicate a URI. A URI can worsen asthma and chronic obstructive pulmonary disease (COPD). Sometimes, these complications can require emergency medical care and may be life threatening.  °CAUSES °Almost all URIs are caused by viruses. A virus is a type of germ and can spread from one person to another.  °RISKS FACTORS °You may be at risk for a URI if:  °· You smoke.   °· You have chronic heart or lung disease. °· You have a weakened defense (immune) system.   °· You are very young or very old.   °· You have nasal allergies or asthma. °· You work in crowded or poorly ventilated areas. °· You work in health care facilities or schools. °SIGNS AND SYMPTOMS  °Symptoms typically develop 2-3 days after you come in contact with a cold virus. Most viral URIs last 7-10 days. However, viral URIs from the influenza virus (flu virus) can last 14-18 days and are typically more severe. Symptoms may include:  °· Runny or stuffy (congested) nose.   °· Sneezing.   °· Cough.   °· Sore throat.   °· Headache.   °· Fatigue.   °· Fever.   °· Loss of appetite.   °· Pain in your forehead, behind your eyes, and over your cheekbones (sinus pain). °· Muscle aches.   °DIAGNOSIS  °Your health care provider may diagnose a URI by: °· Physical exam. °· Tests to check that your symptoms are not due to  another condition such as: °· Strep throat. °· Sinusitis. °· Pneumonia. °· Asthma. °TREATMENT  °A URI goes away on its own with time. It cannot be cured with medicines, but medicines may be prescribed or recommended to relieve symptoms. Medicines may help: °· Reduce your fever. °· Reduce your cough. °· Relieve nasal congestion. °HOME CARE INSTRUCTIONS  °· Take medicines only as directed by your health care provider.   °· Gargle warm saltwater or take cough drops to comfort your throat as directed by your health care provider. °· Use a warm mist humidifier or inhale steam from a shower to increase air moisture. This may make it easier to breathe. °· Drink enough fluid to keep your urine clear or pale yellow.   °· Eat soups and other clear broths and maintain good nutrition.   °· Rest as needed.   °· Return to work when your temperature has returned to normal or as your health care provider advises. You may need to stay home longer to avoid infecting others. You can also use a face mask and careful hand washing to prevent spread of the virus. °· Increase the usage of your inhaler if you have asthma.   °· Do not use any tobacco products, including cigarettes, chewing tobacco, or electronic cigarettes. If you need help quitting, ask your health care provider. °PREVENTION  °The best way to protect yourself from getting a cold is to practice good hygiene.  °· Avoid oral or hand contact with people with cold   symptoms.   °· Wash your hands often if contact occurs.   °There is no clear evidence that vitamin C, vitamin E, echinacea, or exercise reduces the chance of developing a cold. However, it is always recommended to get plenty of rest, exercise, and practice good nutrition.  °SEEK MEDICAL CARE IF:  °· You are getting worse rather than better.   °· Your symptoms are not controlled by medicine.   °· You have chills. °· You have worsening shortness of breath. °· You have brown or red mucus. °· You have yellow or brown nasal  discharge. °· You have pain in your face, especially when you bend forward. °· You have a fever. °· You have swollen neck glands. °· You have pain while swallowing. °· You have white areas in the back of your throat. °SEEK IMMEDIATE MEDICAL CARE IF:  °· You have severe or persistent: °¨ Headache. °¨ Ear pain. °¨ Sinus pain. °¨ Chest pain. °· You have chronic lung disease and any of the following: °¨ Wheezing. °¨ Prolonged cough. °¨ Coughing up blood. °¨ A change in your usual mucus. °· You have a stiff neck. °· You have changes in your: °¨ Vision. °¨ Hearing. °¨ Thinking. °¨ Mood. °MAKE SURE YOU:  °· Understand these instructions. °· Will watch your condition. °· Will get help right away if you are not doing well or get worse. °  °This information is not intended to replace advice given to you by your health care provider. Make sure you discuss any questions you have with your health care provider. °  °Document Released: 06/14/2000 Document Revised: 05/05/2014 Document Reviewed: 03/26/2013 °Elsevier Interactive Patient Education ©2016 [employer] Inc. ° °Cough, Adult °A cough helps to clear your throat and lungs. A cough may last only 2-3 weeks (acute), or it may last longer than 8 weeks (chronic). Many different things can cause a cough. A cough may be a sign of an illness or another medical condition. °HOME CARE °· Pay attention to any changes in your cough. °· Take medicines only as told by your doctor. °¨ If you were prescribed an antibiotic medicine, take it as told by your doctor. Do not stop taking it even if you start to feel better. °¨ Talk with your doctor before you try using a cough medicine. °· Drink enough fluid to keep your pee (urine) clear or pale yellow. °· If the air is dry, use a cold steam vaporizer or humidifier in your home. °· Stay away from things that make you cough at work or at home. °· If your cough is worse at night, try using extra pillows to raise your head up higher while you  sleep. °· Do not smoke, and try not to be around smoke. If you need help quitting, ask your doctor. °· Do not have caffeine. °· Do not drink alcohol. °· Rest as needed. °GET HELP IF: °· You have new problems (symptoms). °· You cough up yellow fluid (pus). °· Your cough does not get better after 2-3 weeks, or your cough gets worse. °· Medicine does not help your cough and you are not sleeping well. °· You have pain that gets worse or pain that is not helped with medicine. °· You have a fever. °· You are losing weight and you do not know why. °· You have night sweats. °GET HELP RIGHT AWAY IF: °· You cough up blood. °· You have trouble breathing. °· Your heartbeat is very fast. °  °This information is not intended to replace   advice given to you by your health care provider. Make sure you discuss any questions you have with your health care provider. °  °Document Released: 09/01/2010 Document Revised: 09/09/2014 Document Reviewed: 02/25/2014 °Elsevier Interactive Patient Education ©2016 Else[employer] Inc. ° °

## 2015-03-04 NOTE — ED Notes (Signed)
Sore throat and cough, onset Tuesday.  Last meds 9:30am.  No fever in department.

## 2015-03-04 NOTE — ED Provider Notes (Signed)
CSN: [id number]     Arrival date & time 03/04/15  1300 History   First MD Initiated Contact with Patient 03/04/15 1313     Chief Complaint  Patient presents with  . Sore Throat   (Consider location/radiation/quality/duration/timing/severity/associated sxs/prior Treatment) HPI History obtained from patient:   LOCATION:throat/upper resp SEVERITY:6 DURATION: Tuesday night CONTEXT:sudden onset QUALITY:scratchy MODIFYING FACTORS:OTC meds without relief ASSOCIATED SYMPTOMS:hurts to swallow, fever, runny nose, cough recently tx for back pain states symptoms are not better. TIMING:now constant  Past Medical History  Diagnosis Date  . Scoliosis   . Chronic back pain   . Sciatica    No past surgical history on file. No family history on file. Social History  Substance Use Topics  . Smoking status: Current Every Day Smoker -- 0.50 packs/day    Types: Cigarettes  . Smokeless tobacco: Not on file  . Alcohol Use: No    Review of Systems Sore throat Allergies  Amoxicillin and Penicillins  Home Medications   Prior to Admission medications   Medication Sig Start Date End Date Taking? Authorizing Provider  Acetaminophen-Aspirin Buffered (EXCEDRIN BACK & BODY) 250-250 MG tablet Take 2 tablets by mouth every 4 (four) hours as needed. For pain.    Historical Provider, MD  baclofen (LIORESAL) 20 MG tablet Take 1 tablet (20 mg total) by mouth 3 (three) times daily. 02/21/15   Linna Hoff, MD  cyclobenzaprine (FLEXERIL) 5 MG tablet Take 1 tablet (5 mg total) by mouth 3 (three) times daily as needed for muscle spasms. 01/12/15   Cheri Fowler, PA-C  ibuprofen (ADVIL,MOTRIN) 800 MG tablet Take 1 tablet (800 mg total) by mouth 3 (three) times daily. 01/12/15   Cheri Fowler, PA-C  Multiple Vitamin (MULTIVITAMIN WITH MINERALS) TABS Take 1 tablet by mouth daily.    Historical Provider, MD  oxyCODONE-acetaminophen (PERCOCET/ROXICET) 5-325 MG per tablet Take 1 tablet by mouth every 6 (six) hours as needed  for pain. 01/11/12   Lisette Paz, PA-C  oxyCODONE-acetaminophen (PERCOCET/ROXICET) 5-325 MG tablet Take 1-2 tablets by mouth every 6 (six) hours as needed for severe pain. 01/13/15   Ace Gins Sam, PA-C  predniSONE (DELTASONE) 50 MG tablet 1 tab daily for 2 days then 1/2 tab daily for 2 days. 02/21/15   Linna Hoff, MD  vitamin C (ASCORBIC ACID) 500 MG tablet Take 500 mg by mouth daily.    Historical Provider, MD   Meds Ordered and Administered this Visit  Medications - No data to display  BP 144/90 mmHg  Pulse 109  Temp(Src) 98.8 F (37.1 C) (Oral)  Resp 16  SpO2 97% No data found.   Physical Exam NURSES NOTES AND VITAL SIGNS REVIEWED. CONSTITUTIONAL: Well developed, well nourished, no acute distress HEENT: normocephalic, atraumatic, right and left TM's are normal EYES: Conjunctiva normal NECK:normal ROM, supple, no adenopathy PULMONARY:No respiratory distress, normal effort, Lungs: CTAb/l, no wheezes, or increased work of breathing CARDIOVASCULAR: RRR, no murmur ABDOMEN: soft, ND, NT, +'ve BS MUSCULOSKELETAL: Normal ROM of all extremities,  SKIN: warm and dry without rash PSYCHIATRIC: Mood and affect, behavior are normal  ED Course  Procedures (including critical care time)  Labs Review Labs Reviewed - No data to display  Imaging Review No results found.   Visual Acuity Review  Right Eye Distance:   Left Eye Distance:   Bilateral Distance:    Right Eye Near:   Left Eye Near:    Bilateral Near:        Treated with duo neb with:symptoms  are a bit better, continued sore throat. Also would like new pain meds for back pain, Advised that i could give him prednisone, pt states it does not help. Rx: zpak and albuterol MDM  No diagnosis found.  Patient is reassured that there is no indication for more advance testing at this time.  Patient is advised to continue home symptomatic treatment.  Patient is advised that if there are new or worsening symptoms or attend the  emergency department, or contact primary care provider. Instructions of care provided discharged home in stable condition. Return to work/school note provided.  THIS NOTE WAS GENERATED USING A VOICE RECOGNITION SOFTWARE PROGRAM. ALL REASONABLE EFFORTS  WERE MADE TO PROOFREAD THIS DOCUMENT FOR ACCURACY.     Tharon Aquas, PA 03/04/15 1424

## 2015-03-19 ENCOUNTER — Emergency Department (HOSPITAL_COMMUNITY)
Admission: EM | Admit: 2015-03-19 | Discharge: 2015-03-19 | Disposition: A | Discharge status: Home / Self Care | Payer: No Typology Code available for payment source | Attending: Emergency Medicine | Admitting: Emergency Medicine

## 2015-03-19 ENCOUNTER — Encounter (HOSPITAL_COMMUNITY): Payer: Self-pay | Admitting: Emergency Medicine

## 2015-03-19 DIAGNOSIS — Z79899 Other long term (current) drug therapy: Secondary | ICD-10-CM | POA: Insufficient documentation

## 2015-03-19 DIAGNOSIS — Z880 Allergy status to penicillin: Secondary | ICD-10-CM | POA: Insufficient documentation

## 2015-03-19 DIAGNOSIS — F17210 Nicotine dependence, cigarettes, uncomplicated: Secondary | ICD-10-CM | POA: Insufficient documentation

## 2015-03-19 DIAGNOSIS — M5430 Sciatica, unspecified side: Secondary | ICD-10-CM | POA: Insufficient documentation

## 2015-03-19 DIAGNOSIS — M545 Low back pain: Secondary | ICD-10-CM

## 2015-03-19 DIAGNOSIS — M419 Scoliosis, unspecified: Secondary | ICD-10-CM | POA: Insufficient documentation

## 2015-03-19 DIAGNOSIS — Z792 Long term (current) use of antibiotics: Secondary | ICD-10-CM | POA: Insufficient documentation

## 2015-03-19 DIAGNOSIS — G8929 Other chronic pain: Secondary | ICD-10-CM | POA: Insufficient documentation

## 2015-03-19 MED ORDER — NAPROXEN 500 MG PO TABS: 500.0000 mg | ORAL_TABLET | Freq: Two times a day (BID) | ORAL | Status: AC

## 2015-03-19 MED ORDER — OXYCODONE-ACETAMINOPHEN 5-325 MG PO TABS: 1.0000 | ORAL_TABLET | Freq: Four times a day (QID) | ORAL | Status: DC | PRN

## 2015-03-19 NOTE — ED Provider Notes (Signed)
CSN: [id number]     Arrival date & time 03/19/15  1251 History  By signing my name below, I, Gregory Prince, attest that this documentation has been prepared under the direction and in the presence of Arthor Captain, PA-C Electronically Signed: Charline Bills, ED Scribe 03/19/2015 at 1:55 PM.   Chief Complaint  Patient presents with  . Back Pain   The history is provided by the patient. No language interpreter was used.   HPI Comments: Gregory Prince is a 30 y.o. male, with a h/o scoliosis and chronic back pain, who presents to the Emergency Department complaining of gradually worsening back pain for the past few days. No recent fall or injury. Pt states that pain is improved with alternating between sitting and standing. He has also tried Baclofen and Excedrin back and body with temporary relief. States he does not take Baclofen often since it makes him feel groggy. He has tried Flexeril in the past and notes the same side effects. He denies weakness in lower extremities, bladder/bowel incontinence. Pt has seen a specialist in the past and states that he does the stretches that they taught him. Pt is a smoker.   Past Medical History  Diagnosis Date  . Scoliosis   . Chronic back pain   . Sciatica    History reviewed. No pertinent past surgical history. No family history on file. Social History  Substance Use Topics  . Smoking status: Current Every Day Smoker -- 0.50 packs/day    Types: Cigarettes  . Smokeless tobacco: None  . Alcohol Use: No    Review of Systems  Constitutional: Negative for fever.  Musculoskeletal: Positive for back pain.  Neurological: Negative for weakness.   Allergies  Amoxicillin and Penicillins  Home Medications   Prior to Admission medications   Medication Sig Start Date End Date Taking? Authorizing Provider  Acetaminophen-Aspirin Buffered (EXCEDRIN BACK & BODY) 250-250 MG tablet Take 2 tablets by mouth every 4 (four) hours as needed. For pain.     Historical Provider, MD  albuterol (PROVENTIL HFA;VENTOLIN HFA) 108 (90 Base) MCG/ACT inhaler Inhale 2 puffs into the lungs every 4 (four) hours as needed for wheezing or shortness of breath. 03/04/15   Tharon Aquas, PA  azithromycin (ZITHROMAX Z-PAK) 250 MG tablet Take 1 tablet (250 mg total) by mouth daily. 03/04/15   Tharon Aquas, PA  baclofen (LIORESAL) 20 MG tablet Take 1 tablet (20 mg total) by mouth 3 (three) times daily. 02/21/15   Linna Hoff, MD  cyclobenzaprine (FLEXERIL) 5 MG tablet Take 1 tablet (5 mg total) by mouth 3 (three) times daily as needed for muscle spasms. 01/12/15   Cheri Fowler, PA-C  ibuprofen (ADVIL,MOTRIN) 800 MG tablet Take 1 tablet (800 mg total) by mouth 3 (three) times daily. Patient not taking: Reported on 03/04/2015 01/12/15   Cheri Fowler, PA-C  Multiple Vitamin (MULTIVITAMIN WITH MINERALS) TABS Take 1 tablet by mouth daily.    Historical Provider, MD  oxyCODONE-acetaminophen (PERCOCET/ROXICET) 5-325 MG per tablet Take 1 tablet by mouth every 6 (six) hours as needed for pain. 01/11/12   Lisette Paz, PA-C  oxyCODONE-acetaminophen (PERCOCET/ROXICET) 5-325 MG tablet Take 1-2 tablets by mouth every 6 (six) hours as needed for severe pain. 01/13/15   Ace Gins Sam, PA-C  predniSONE (DELTASONE) 50 MG tablet 1 tab daily for 2 days then 1/2 tab daily for 2 days. Patient not taking: Reported on 03/04/2015 02/21/15   Linna Hoff, MD  vitamin C (ASCORBIC ACID) 500  MG tablet Take 500 mg by mouth daily.    Historical Provider, MD   BP 148/85 mmHg  Pulse 113  Temp(Src) 97.8 F (36.6 C) (Oral)  Resp 16  SpO2 100% Physical Exam  Constitutional: He is oriented to person, place, and time. He appears well-developed and well-nourished. No distress.  HENT:  Head: Normocephalic and atraumatic.  Eyes: Conjunctivae and EOM are normal.  Neck: Neck supple. No tracheal deviation present.  Cardiovascular: Normal rate.   Pulmonary/Chest: Effort normal. No respiratory distress.   Musculoskeletal: Normal range of motion.  Pretty significant scoliosis of the spine on palpation. Significant spasms bilaterally in lumbar paraspinal muscles. Movement is guarded. ROM is significantly decreased. Looks severely uncomfortable.   Neurological: He is alert and oriented to person, place, and time. He has normal reflexes.  Skin: Skin is warm and dry.  Psychiatric: He has a normal mood and affect. His behavior is normal.  Nursing note and vitals reviewed.  ED Course  Procedures (including critical care time) DIAGNOSTIC STUDIES: Oxygen Saturation is 100% on RA, normal by my interpretation.    COORDINATION OF CARE: 1:52 PM-Discussed treatment plan which includes Robaxin with pt at bedside and pt agreed to plan.   Labs Review Labs Reviewed - No data to display  Imaging Review No results found.   EKG Interpretation None      MDM   Final diagnoses:  Low back pain without sciatica, unspecified back pain laterality    I personally performed the services described in this documentation, which was scribed in my presence. The recorded information has been reviewed and is accurate.     Patient with back pain.  No neurological deficits and normal neuro exam.  Patient can walk but states is painful.  No loss of bowel or bladder control.  No concern for cauda equina.  No fever, night sweats, weight loss, h/o cancer, IVDU.  RICE protocol and pain medicine indicated and discussed with patient.      Arthor Captainbigail Jalie Eiland, PA-C 03/19/15 1416  Glynn OctaveStephen Rancour, MD 03/19/15 [phone]

## 2015-03-19 NOTE — Discharge Instructions (Signed)
Gregory DallasRob Prince [phone] [address] [place], [place] [id number] [email or web address]    Scoliosis Scoliosis is the name given to a spine that curves sideways.Scoliosis can cause twisting of your shoulders, hips, chest, back, and rib cage.  CAUSES  The cause of scoliosis is not always known. It may be caused by a birth defect or by a disease that can cause muscular dysfunction and imbalance, such as cerebral palsy and muscular dystrophy.  RISK FACTORS Having a disease that causes muscle disease or dysfunction. SIGNS AND SYMPTOMS Scoliosis often has no signs or symptoms.If they are present, they may include:  Unequal size of one body side compared to the other (asymmetry).  Visible curvature of the spine.  Pain. The pain may limit physical activity.  Shortness of breath.  Bowel or bladder issues. DIAGNOSIS A skilled health care provider will perform an evaluation. This will involve:  Taking your history.  Performing a physical examination.  Performing a neurological exam to detect nerve or muscle function loss.  Range of motion studies on the spine.  X-rays. An MRI may also be obtained. TREATMENT  Treatment varies depending on the nature, extent, and severity of the disease. If the curvature is not great, you may need only observation. A brace may be used to prevent scoliosis from progressing. A brace may also be needed during growth spurts. Physical therapy may be of benefit. Surgery may be required.  HOME CARE INSTRUCTIONS   Your health care provider may suggest exercises to strengthen your muscles. Perform them as directed.  Ask your health care provider before participating in any sports.   If you have been prescribed an orthopedic brace, wear it as instructed by your health care provider. SEEK MEDICAL CARE IF: Your brace causes the skin to become sore (chafe) or is uncomfortable.  SEEK IMMEDIATE MEDICAL CARE IF:  You have back pain that is not  relieved by the medicines prescribed by your health care provider.   Your legs feel weak or you lose function in your legs.  You lose some bowel or bladder control.    This information is not intended to replace advice given to you by your health care provider. Make sure you discuss any questions you have with your health care provider.   Document Released: 12/17/1999 Document Revised: 12/24/2012 Document Reviewed: 08/26/2012 Elsevier Interactive Patient Education 2016 Elsevier Inc[employer]  Back Pain, Adult Back pain is very common in adults.The cause of back pain is rarely dangerous and the pain often gets better over time.The cause of your back pain may not be known. Some common causes of back pain include:  Strain of the muscles or ligaments supporting the spine.  Wear and tear (degeneration) of the spinal disks.  Arthritis.  Direct injury to the back. For many people, back pain may return. Since back pain is rarely dangerous, most people can learn to manage this condition on their own. HOME CARE INSTRUCTIONS Watch your back pain for any changes. The following actions may help to lessen any discomfort you are feeling:  Remain active. It is stressful on your back to sit or stand in one place for long periods of time. Do not sit, drive, or stand in one place for more than 30 minutes at a time. Take short walks on even surfaces as soon as you are able.Try to increase the length of time you walk each day.  Exercise regularly as directed by your health care provider. Exercise helps your back heal faster. It also helps avoid  future injury by keeping your muscles strong and flexible.  Do not stay in bed.Resting more than 1-2 days can delay your recovery.  Pay attention to your body when you bend and lift. The most comfortable positions are those that put less stress on your recovering back. Always use proper lifting techniques, including:  Bending your knees.  Keeping the load close to  your body.  Avoiding twisting.  Find a comfortable position to sleep. Use a firm mattress and lie on your side with your knees slightly bent. If you lie on your back, put a pillow under your knees.  Avoid feeling anxious or stressed.Stress increases muscle tension and can worsen back pain.It is important to recognize when you are anxious or stressed and learn ways to manage it, such as with exercise.  Take medicines only as directed by your health care provider. Over-the-counter medicines to reduce pain and inflammation are often the most helpful.Your health care provider may prescribe muscle relaxant drugs.These medicines help dull your pain so you can more quickly return to your normal activities and healthy exercise.  Apply ice to the injured area:  Put ice in a plastic bag.  Place a towel between your skin and the bag.  Leave the ice on for 20 minutes, 2-3 times a day for the first 2-3 days. After that, ice and heat may be alternated to reduce pain and spasms.  Maintain a healthy weight. Excess weight puts extra stress on your back and makes it difficult to maintain good posture. SEEK MEDICAL CARE IF:  You have pain that is not relieved with rest or medicine.  You have increasing pain going down into the legs or buttocks.  You have pain that does not improve in one week.  You have night pain.  You lose weight.  You have a fever or chills. SEEK IMMEDIATE MEDICAL CARE IF:   You develop new bowel or bladder control problems.  You have unusual weakness or numbness in your arms or legs.  You develop nausea or vomiting.  You develop abdominal pain.  You feel faint.   This information is not intended to replace advice given to you by your health care provider. Make sure you discuss any questions you have with your health care provider.   Document Released: 12/19/2004 Document Revised: 01/09/2014 Document Reviewed: 04/22/2013 Elsevier Interactive Patient Education [employer].

## 2015-04-05 ENCOUNTER — Emergency Department (HOSPITAL_COMMUNITY)
Admission: EM | Admit: 2015-04-05 | Discharge: 2015-04-05 | Disposition: A | Discharge status: Home / Self Care | Payer: No Typology Code available for payment source | Attending: Emergency Medicine | Admitting: Emergency Medicine

## 2015-04-05 ENCOUNTER — Encounter (HOSPITAL_COMMUNITY): Payer: Self-pay | Admitting: *Deleted

## 2015-04-05 DIAGNOSIS — Z880 Allergy status to penicillin: Secondary | ICD-10-CM | POA: Insufficient documentation

## 2015-04-05 DIAGNOSIS — Y9389 Activity, other specified: Secondary | ICD-10-CM | POA: Insufficient documentation

## 2015-04-05 DIAGNOSIS — M545 Low back pain, unspecified: Secondary | ICD-10-CM

## 2015-04-05 DIAGNOSIS — Z79899 Other long term (current) drug therapy: Secondary | ICD-10-CM | POA: Insufficient documentation

## 2015-04-05 DIAGNOSIS — M419 Scoliosis, unspecified: Secondary | ICD-10-CM | POA: Insufficient documentation

## 2015-04-05 DIAGNOSIS — G8929 Other chronic pain: Secondary | ICD-10-CM | POA: Insufficient documentation

## 2015-04-05 DIAGNOSIS — F17210 Nicotine dependence, cigarettes, uncomplicated: Secondary | ICD-10-CM | POA: Insufficient documentation

## 2015-04-05 DIAGNOSIS — Y998 Other external cause status: Secondary | ICD-10-CM | POA: Insufficient documentation

## 2015-04-05 DIAGNOSIS — X501XXA Overexertion from prolonged static or awkward postures, initial encounter: Secondary | ICD-10-CM | POA: Insufficient documentation

## 2015-04-05 DIAGNOSIS — Z792 Long term (current) use of antibiotics: Secondary | ICD-10-CM | POA: Insufficient documentation

## 2015-04-05 DIAGNOSIS — Y9289 Other specified places as the place of occurrence of the external cause: Secondary | ICD-10-CM | POA: Insufficient documentation

## 2015-04-05 DIAGNOSIS — Z791 Long term (current) use of non-steroidal anti-inflammatories (NSAID): Secondary | ICD-10-CM | POA: Insufficient documentation

## 2015-04-05 DIAGNOSIS — S3992XA Unspecified injury of lower back, initial encounter: Secondary | ICD-10-CM | POA: Insufficient documentation

## 2015-04-05 DIAGNOSIS — M5430 Sciatica, unspecified side: Secondary | ICD-10-CM | POA: Insufficient documentation

## 2015-04-05 MED ORDER — OXYCODONE-ACETAMINOPHEN 5-325 MG PO TABS: 1.0000 | ORAL_TABLET | Freq: Four times a day (QID) | ORAL | Status: AC | PRN

## 2015-04-05 MED ORDER — KETOROLAC TROMETHAMINE 30 MG/ML IJ SOLN
30.0000 mg | Freq: Once | INTRAMUSCULAR | Status: AC
  Administered 2015-04-05: 30 mg via INTRAMUSCULAR
  Filled 2015-04-05: qty 1

## 2015-04-05 MED ORDER — HYDROCODONE-ACETAMINOPHEN 5-325 MG PO TABS
1.0000 | ORAL_TABLET | Freq: Once | ORAL | Status: AC
  Administered 2015-04-05: 1 via ORAL
  Filled 2015-04-05: qty 1

## 2015-04-05 NOTE — ED Provider Notes (Signed)
CSN: [id number]     Arrival date & time 04/05/15  1649 History  By signing my name below, I, Soijett Blue, attest that this documentation has been prepared under the direction and in the presence of Elizabeth Sauer, PA-C Electronically Signed: Soijett Blue, ED Scribe. 04/05/2015. 6:12 PM.    Chief Complaint  Patient presents with  . Back Pain    The history is provided by the patient. No language interpreter was used.    HPI Comments: Gregory Prince is a 30 y.o. male with a medical hx of chronic back pain, scoliosis, and sciatica who presents to the Emergency Department complaining of mid-lower back pain onset 3 days ago. Pt states that he was doing chores helping an older relative when he twisted wrong. Pt denies feeling back pain immediately following the incident, but has had gradual onset/worsening of his mid-lower back pain since that time. Pt notes that he takes baclofen and naprosyn daily for his symptoms. He reports that the back pain does radiate intermittently to his left leg. Sxs today are consistent with previous low back pain flares. Pt denies bowel/bladder incontinence, fever, numbness, tingling, gait problem, and any other symptoms.   Per pt chart review: Pt was seen in the ED on 03/19/15 for low back pain. Pt was Rx percocet, naprosyn and instructed on RICE method. Pt was seen at Urgent Care on 02/21/15 for low back pain and was informed to continue use of ibuprofen. Pt was seen in the ED on 01/13/15 for low back pain and given percocet and toradol while in the ED. Pt was informed to continue flexeril and NSAID treatment. Pt was seen in the ED on 01/12/15 for low back pain and was Rx flexeril, ibuprofen, and instructed to use ice.   Past Medical History  Diagnosis Date  . Scoliosis   . Chronic back pain   . Sciatica    History reviewed. No pertinent past surgical history. History reviewed. No pertinent family history. Social History  Substance Use Topics  . Smoking status: Current  Every Day Smoker -- 0.50 packs/day    Types: Cigarettes  . Smokeless tobacco: None  . Alcohol Use: No    Review of Systems  Gastrointestinal:       No bowel incontinence  Genitourinary:       No bladder incontinence  Musculoskeletal: Positive for back pain. Negative for gait problem.  Skin: Negative for color change, rash and wound.  Neurological: Negative for numbness.       No tingling     Allergies  Amoxicillin and Penicillins  Home Medications   Prior to Admission medications   Medication Sig Start Date End Date Taking? Authorizing Provider  Acetaminophen-Aspirin Buffered (EXCEDRIN BACK & BODY) 250-250 MG tablet Take 2 tablets by mouth every 4 (four) hours as needed. For pain.    Historical Provider, MD  albuterol (PROVENTIL HFA;VENTOLIN HFA) 108 (90 Base) MCG/ACT inhaler Inhale 2 puffs into the lungs every 4 (four) hours as needed for wheezing or shortness of breath. 03/04/15   Tharon Aquas, PA  azithromycin (ZITHROMAX Z-PAK) 250 MG tablet Take 1 tablet (250 mg total) by mouth daily. 03/04/15   Tharon Aquas, PA  baclofen (LIORESAL) 20 MG tablet Take 1 tablet (20 mg total) by mouth 3 (three) times daily. 02/21/15   Linna Hoff, MD  cyclobenzaprine (FLEXERIL) 5 MG tablet Take 1 tablet (5 mg total) by mouth 3 (three) times daily as needed for muscle spasms. 01/12/15   Dorathy Daft  Rose, PA-C  Multiple Vitamin (MULTIVITAMIN WITH MINERALS) TABS Take 1 tablet by mouth daily.    Historical Provider, MD  naproxen (NAPROSYN) 500 MG tablet Take 1 tablet (500 mg total) by mouth 2 (two) times daily. 03/19/15   Arthor Captain, PA-C  oxyCODONE-acetaminophen (PERCOCET/ROXICET) 5-325 MG tablet Take 1-2 tablets by mouth every 6 (six) hours as needed for severe pain. 04/05/15   Chase Picket Grizel Vesely, PA-C  predniSONE (DELTASONE) 50 MG tablet 1 tab daily for 2 days then 1/2 tab daily for 2 days. Patient not taking: Reported on 03/04/2015 02/21/15   Linna Hoff, MD  vitamin C (ASCORBIC ACID) 500 MG tablet  Take 500 mg by mouth daily.    Historical Provider, MD   BP 122/88 mmHg  Pulse 114  Temp(Src) 98.1 F (36.7 C) (Oral)  Resp 18  SpO2 98% Physical Exam  Constitutional: He is oriented to person, place, and time. He appears well-developed and well-nourished. No distress.  NAD  HENT:  Head: Normocephalic and atraumatic.  Eyes: EOM are normal.  Neck: Neck supple.   Full ROM without pain  No midline tenderness  No tenderness of paraspinal musculature  Cardiovascular: Normal rate, regular rhythm, normal heart sounds and intact distal pulses.  Exam reveals no gallop and no friction rub.   No murmur heard. Pulmonary/Chest: Effort normal and breath sounds normal. No respiratory distress. He has no wheezes. He has no rales.  Abdominal: Soft. Bowel sounds are normal. He exhibits no distension. There is no tenderness.  Musculoskeletal: Normal range of motion.  Patient is able to ambulate without difficulty.  No noted deformities or signs of inflammation. No overlying skin changes. No midline tenderness; tenderness to palpation of left lumbar paraspinal musculature Straight leg raises are negative bilaterally for radicular symptoms. 5/5 muscle strength of bilateral LE's.  Neurological: He is alert and oriented to person, place, and time. He has normal reflexes.   Bilateral lower extremities neurovascularly intact.  Skin: Skin is warm and dry. No rash noted. No erythema.  Psychiatric: He has a normal mood and affect. His behavior is normal.  Nursing note and vitals reviewed.   ED Course  Procedures (including critical care time) DIAGNOSTIC STUDIES: Oxygen Saturation is 97% on RA, nl by my interpretation.    COORDINATION OF CARE: 6:12 PM Discussed treatment plan with pt at bedside which includes toradol injection, pain medication in ED, and pain medication Rx and pt agreed to plan.    Labs Review Labs Reviewed - No data to display  Imaging Review No results found.    EKG  Interpretation None      MDM   Final diagnoses:  Bilateral low back pain without sciatica   Gregory Prince presents with back pain. No red flag sxs of low back pain. Normal neuro exam. No loss of bowel or bladder control. No concern for cauda equina. No fever, night sweats, weight loss, h/o cancer, IVDU. Lower extremities are neurovascularly intact and patient is ambulating without difficulty. Back pain today is consistent with previous back pain flares. Patient was counseled on back pain precautions and told to do activity as tolerated but do not lift, push, or pull heavy objects more than 10 pounds for the next week. Patient counseled to use ice or heat on back for no longer than 15 minutes every hour.   Patient prescribed narcotic pain medicine and counseled on proper use of these medications. Patient already takes baclofen and told to continue this medication as needed. Urged patient  not to drink alcohol, drive, or perform any other activities that requires focus while taking either of these medications.   Patient urged to follow-up with PCP if pain does not improve with treatment and rest or if pain becomes recurrent. Urged to return with worsening severe pain, loss of bowel or bladder control, trouble walking.   The patient verbalizes understanding and agrees with the plan.  I personally performed the services described in this documentation, which was scribed in my presence. The recorded information has been reviewed and is accurate.  [facility] For ChildrenJaime Pilcher Hilton Saephan, PA-C 04/05/15 1946  Richardean Canalavid H Yao, MD 04/08/15 [phone]

## 2015-04-05 NOTE — ED Notes (Signed)
Pt reports mid to lower back pain since Friday. Radiates down left leg. Is ambulatory at triage, denies urinary problems.

## 2015-04-05 NOTE — Discharge Instructions (Signed)
1. Medications: Take pain medication only as needed for severe pain - This can make you very drowsy - please do not drink or drive on this medication,  continue naproxen and usual home medications 2. Treatment: rest, drink plenty of fluids, ice affected area as needed 3. Follow Up: Please follow up with your primary doctor in 3 days for discussion of your diagnoses and further evaluation after today's visit; Please return to the ER for new or worsening symptoms, any additional concerns.

## 2015-05-01 ENCOUNTER — Encounter (HOSPITAL_COMMUNITY): Payer: Self-pay | Admitting: *Deleted

## 2015-05-01 ENCOUNTER — Emergency Department (HOSPITAL_COMMUNITY)
Admission: EM | Admit: 2015-05-01 | Discharge: 2015-05-01 | Disposition: A | Discharge status: Home / Self Care | Payer: No Typology Code available for payment source | Attending: Emergency Medicine | Admitting: Emergency Medicine

## 2015-05-01 DIAGNOSIS — F17210 Nicotine dependence, cigarettes, uncomplicated: Secondary | ICD-10-CM | POA: Insufficient documentation

## 2015-05-01 DIAGNOSIS — M419 Scoliosis, unspecified: Secondary | ICD-10-CM | POA: Insufficient documentation

## 2015-05-01 DIAGNOSIS — Z880 Allergy status to penicillin: Secondary | ICD-10-CM | POA: Insufficient documentation

## 2015-05-01 DIAGNOSIS — G8929 Other chronic pain: Secondary | ICD-10-CM | POA: Insufficient documentation

## 2015-05-01 DIAGNOSIS — Z792 Long term (current) use of antibiotics: Secondary | ICD-10-CM | POA: Insufficient documentation

## 2015-05-01 DIAGNOSIS — M545 Low back pain: Secondary | ICD-10-CM | POA: Insufficient documentation

## 2015-05-01 DIAGNOSIS — Z79899 Other long term (current) drug therapy: Secondary | ICD-10-CM | POA: Insufficient documentation

## 2015-05-01 DIAGNOSIS — Z791 Long term (current) use of non-steroidal anti-inflammatories (NSAID): Secondary | ICD-10-CM | POA: Insufficient documentation

## 2015-05-01 MED ORDER — METHOCARBAMOL 500 MG PO TABS
500.0000 mg | ORAL_TABLET | Freq: Once | ORAL | Status: AC
  Administered 2015-05-01: 500 mg via ORAL
  Filled 2015-05-01: qty 1

## 2015-05-01 MED ORDER — METHOCARBAMOL 500 MG PO TABS: 1000.0000 mg | ORAL_TABLET | Freq: Four times a day (QID) | ORAL | Status: AC | PRN

## 2015-05-01 MED ORDER — METHOCARBAMOL 500 MG PO TABS: 1000.0000 mg | ORAL_TABLET | Freq: Four times a day (QID) | ORAL | Status: DC | PRN

## 2015-05-01 MED ORDER — KETOROLAC TROMETHAMINE 60 MG/2ML IM SOLN
30.0000 mg | Freq: Once | INTRAMUSCULAR | Status: AC
  Administered 2015-05-01: 30 mg via INTRAMUSCULAR
  Filled 2015-05-01: qty 2
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
## 2015-05-01 NOTE — Discharge Instructions (Signed)
For pain control you may take up to 800mg  of Motrin (also known as ibuprofen). That is usually 4 over the counter pills,  3 times a day. Take with food to minimize stomach irritation   You can also take  tylenol (acetaminophen) 975mg  (this is 3 over the counter pills) four times a day. Do not drink alcohol or combine with other medications that have acetaminophen as an ingredient (Read the labels!).    For breakthrough pain you may take Robaxin. Do not drink alcohol, drive or operate heavy machinery when taking Robaxin.  Please follow with your primary care doctor in the next 2 days for a check-up. They must obtain records for further management.   Do not hesitate to return to the Emergency Department for any new, worsening or concerning symptoms.   . Chronic Back Pain  When back pain lasts longer than 3 months, it is called chronic back pain.People with chronic back pain often go through certain periods that are more intense (flare-ups).  CAUSES Chronic back pain can be caused by wear and tear (degeneration) on different structures in your back. These structures include:  The bones of your spine (vertebrae) and the joints surrounding your spinal cord and nerve roots (facets).  The strong, fibrous tissues that connect your vertebrae (ligaments). Degeneration of these structures may result in pressure on your nerves. This can lead to constant pain. HOME CARE INSTRUCTIONS  Avoid bending, heavy lifting, prolonged sitting, and activities which make the problem worse.  Take brief periods of rest throughout the day to reduce your pain. Lying down or standing usually is better than sitting while you are resting.  Take over-the-counter or prescription medicines only as directed by your caregiver. SEEK IMMEDIATE MEDICAL CARE IF:   You have weakness or numbness in one of your legs or feet.  You have trouble controlling your bladder or bowels.  You have nausea, vomiting, abdominal pain,  shortness of breath, or fainting.   This information is not intended to replace advice given to you by your health care provider. Make sure you discuss any questions you have with your health care provider.   Document Released: 01/27/2004 Document Revised: 03/13/2011 Document Reviewed: 06/08/2014 Elsevier Interactive Patient Education 2016 [employer]  [employer] Resource Guide Financial Assistance The United Ways 211 is a great source of information about community services available.  Access by dialing 2-1-1 from anywhere in [place], or by website -  PooledIncome.plwww.nc211.org.   Other Local Resources (Updated 01/2015)  Financial Assistance   Services    Phone Number and Address  [facility]  Low-cost medical care - 1st and 3rd Saturday of every month  Must not qualify for public or private insurance and must have limited income [phone] [address] [place], [place]    [place] [employer] Department of Social Services  Child care  Emergency assistance for housing and [employer]  Food stamps  Medicaid [phone] [address] [place], [place] [address]   [facility]  Low-cost medical care for children, communicable diseases, sexually-transmitted diseases, immunizations, maternity care, womens health and family planning [phone] [address] [place], [place] [address]  [facility] Medication Management Clinic   Medication assistance for [facility] residents  Must meet income requirements [phone] [address] [place], [place].    [facility]  Child care  Emergency assistance for housing and [employer]ilities  Food stamps  Medicaid [phone] [address] [place], [place] [id number]  [facility]   Low-cost medical care,   Monday through Friday, 9 am to 6 pm.   Accepts Medicare/Medicaid, and self-pay [phone] [address] [place], [place] [address]  [facility]  Low-cost medical care - Monday through Friday, 8:30 am - 5:30 pm  Accepts Medicaid and self-pay [phone] [address] [place], [place] [address]   [facility]  Primary medical care, including for those with sickle cell disease  Accepts Medicare, Medicaid, insurance and self-pay [phone] [address] [place], [place]  [facility]   Primary medical care  Accepts Medicare, [place], insurance and self-pay [phone] [address] [place], [place] [address]   [facility] Department of Social Services  Child care  Emergency assistance for housing and [employer]  Medicaid [phone] [address] [place], [place] [address]  [facility] Department of Health and [employer]  Child care  Emergency assistance for housing and [employer]  Medicaid [phone] [address] [place], [place] [address]   [facility] Medication Assistance Program  Medication assistance for [facility] residents with no insurance only  Must have a primary care doctor [phone] [address] [place], [place]  [facility]   Primary medical care  [place], [place], insurance  [phone] [address] [place], [place]  [facility]   Medication assistance [phone]  [facility]   Primary medical care  Accepts Medicare, [place], insurance and self-pay [phone] [address] [place], [place] [address]  [facility]   Primary medical care  Accepts Medicare, [place], insurance and self-pay [phone] [address] [place], [place] [address]  [facility]  For [place] residents between the ages of 4 and 1 who do not have any form of health insurance, Medicare, [place], or [place]  benefits.  Services are provided free of charge to uninsured patients who fall within federal poverty guidelines.    Hours: Tuesdays and Thursdays, 4:15 - 8 pm [phone] [address] [place], [place] [address]  [facility]     Primary medical care  Dental care  Nutritional counseling  Pharmacy  Accepts Medicaid, Medicare, most insurance.  Fees are adjusted based on ability to pay.   [phone] [facility] [address] [place], [place]  [phone] Phineas Real [facility] [address] [place], [place]  [phone] [facility] [place], [place]  [phone] [facility], [address] [place], [place]  [phone] [facility] [address] [place], [place]  [facility]  Womens health and family planning [phone] [address] [place], [place]  [facility] Department of Social Services  Child care  Emergency assistance for housing and [employer]  Medicaid [phone] [address], [place], [place] [address]   [facility]    Ages 26 and older  Hours: Mondays and Thursdays, 7:00 am - 9:00 am Patients are seen on a first come, first served basis. [phone], ext. [phone] [place], [place]  [facility] Division of Social Services  Child care  Emergency assistance for housing and [employer]  Medicaid [phone] [address] [place], [place] [address]  [employer]  Medication assistance  Rental assistance  Food pantry  Medication assistance  Housing assistance  Emergency food distribution  Utility assistance [phone] [address] [place], [place]  [phone]  [address] [place], [place] [address] Hours: Tuesdays and Thursdays from 9am - 12 noon by appointment only  [phone] [address] [place], [place] [address]  [facility] - Lanae Boast   Accepts private insurance, [place], and [place].  Payment is based on a sliding scale for those without insurance.  Hours: Mondays, Tuesdays and Thursdays, 8:30 am - 5:30 pm.   [phone] [address] [place], [place]  [facility] - Family Medicine at [facility], [place], and [place].  Payment is based on a sliding scale for those without insurance. [phone] [address] [place], [place]  [facility] - Pediatrics at E. Scientist, research (physical sciences), [employer], and [place].  Payment is based on a sliding scale for those without insurance [phone] [address], [employer], [place]  [facility] - Pediatrics at [employer], [place], and [place].  Payment is based on a sliding scale for those without insurance. [phone] [address] [place], [place]  [facility] - Pediatrics at [facility], [place], and [place].  Payment is based on a sliding scale for those without insurance. [phone], ext. [phone] [place], [place].    [facility]  Maternity care.  Accepts Medicaid and self-pay. [phone] [address] [place], [place]
